# Patient Record
Sex: Female | Born: 1960 | Race: White | Hispanic: No | Marital: Married
Health system: Midwestern US, Academic
[De-identification: ages and names within clinical notes are randomized; demographics above are authoritative.]

---

## 2021-12-19 IMAGING — MR MR lumbar spine wo con
5 of 6 series · 20 of 48 positions shown · non-contrast
Comparison: None available

STUDY TYPE: MRI lumbar spine
TECHNIQUE: Multiplanar multi echo MR imaging was acquired through the lumbar spine
HISTORY: Lower back pain and neck pain, feels a lump in area of lower L-spine, pain down both legs and when sleeping

[Series 101: survey · axial · 10.0mm · 1.56mm/px · z∈[-15,+199]mm · 3 of 9 slices shown]
[im 1/9]
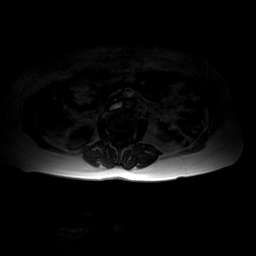
[im 5/9]
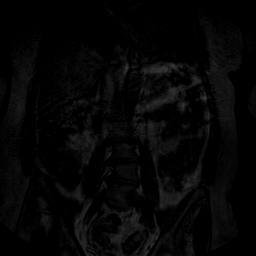
[im 9/9]
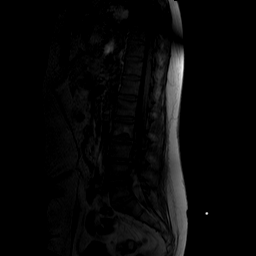

[Series 301: t2w_drive · sagittal · 4.0mm · 0.49mm/px · 5 of 16 slices shown]
[im 1/16]
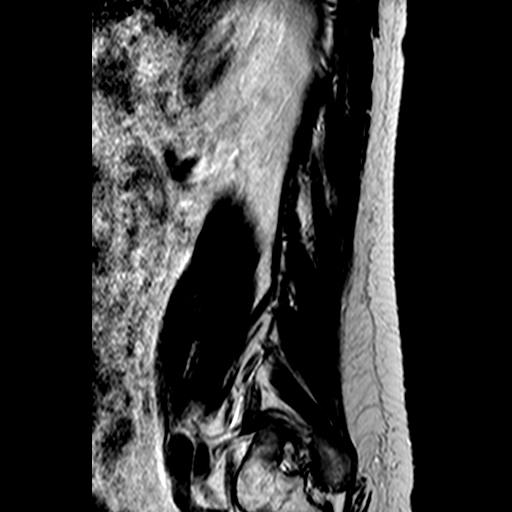
[im 4/16]
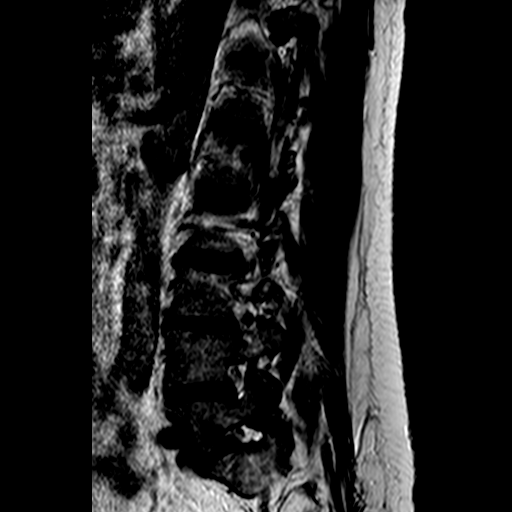
[im 8/16]
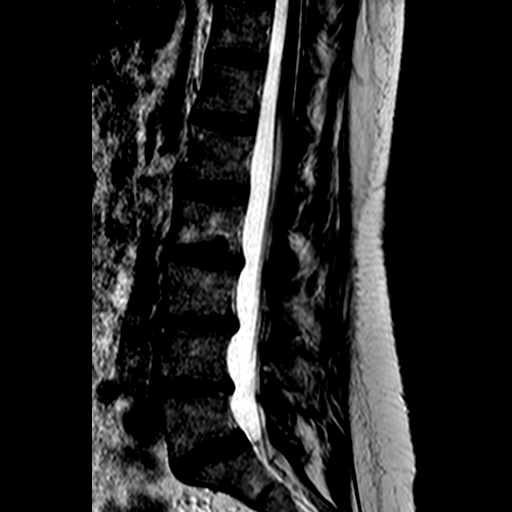
[im 12/16]
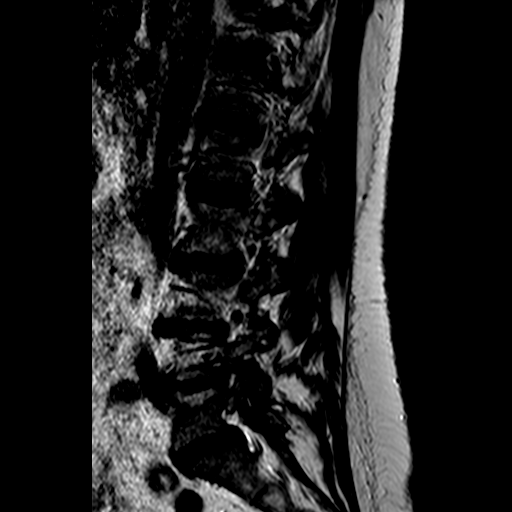
[im 16/16]
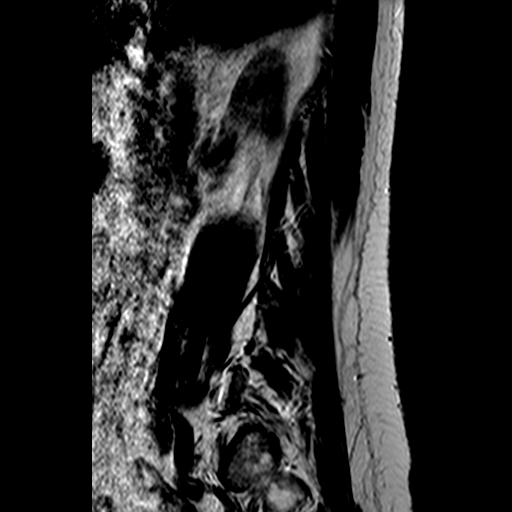

[Series 401: stir_opt. · sagittal · 4.0mm · 0.48mm/px · 5 of 16 slices shown]
[im 1/16]
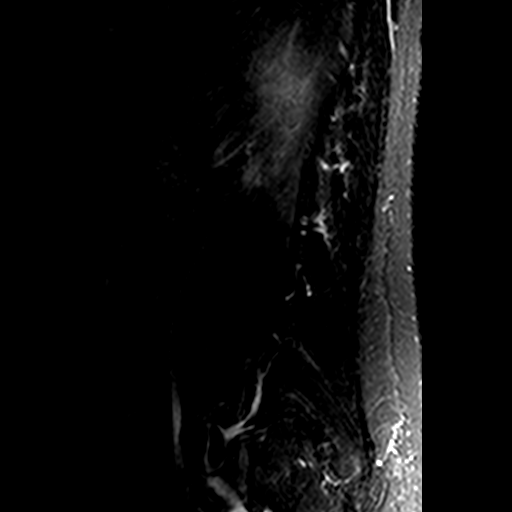
[im 4/16]
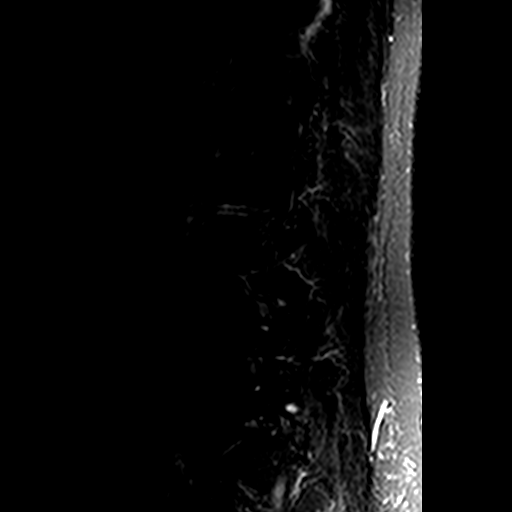
[im 8/16]
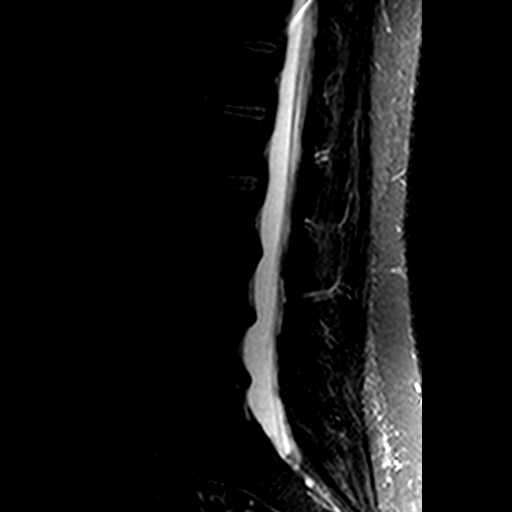
[im 12/16]
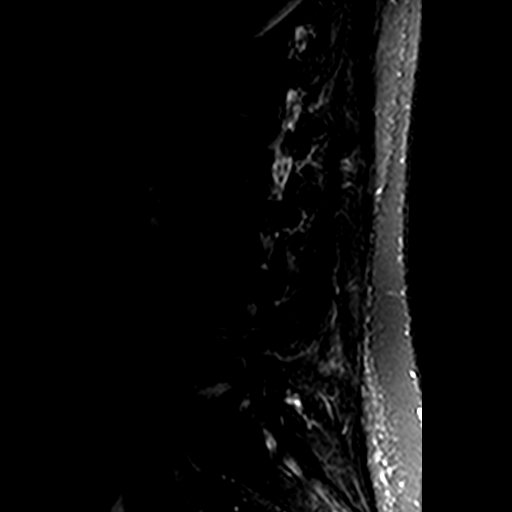
[im 16/16]
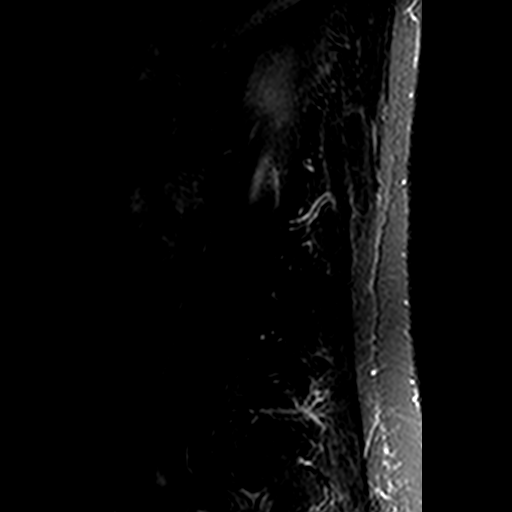

[Series 502: et1w_tse · sagittal · 4.0mm · 0.48mm/px · 5 of 16 slices shown]
[im 1/16]
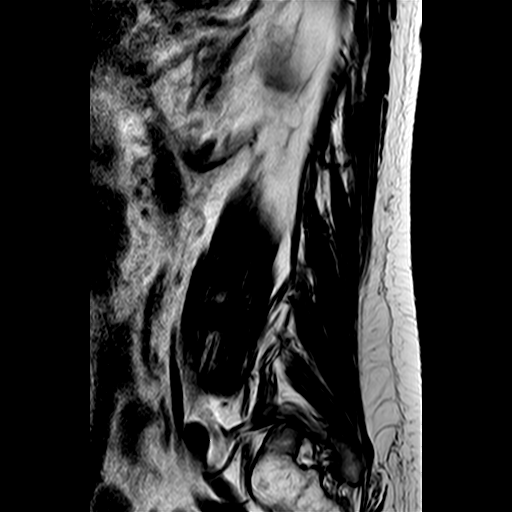
[im 4/16]
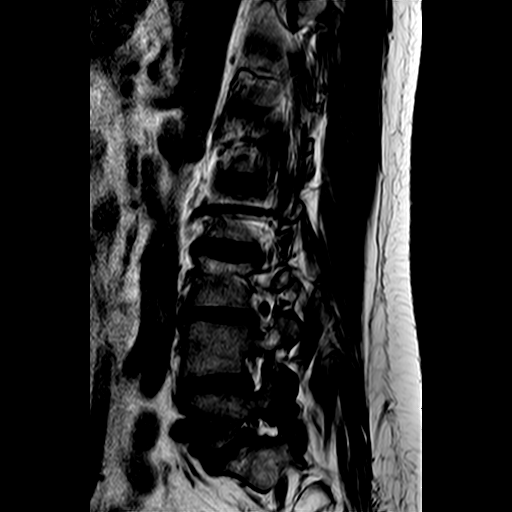
[im 8/16]
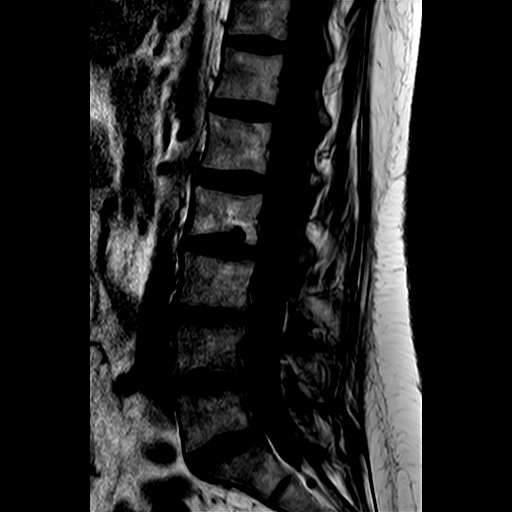
[im 12/16]
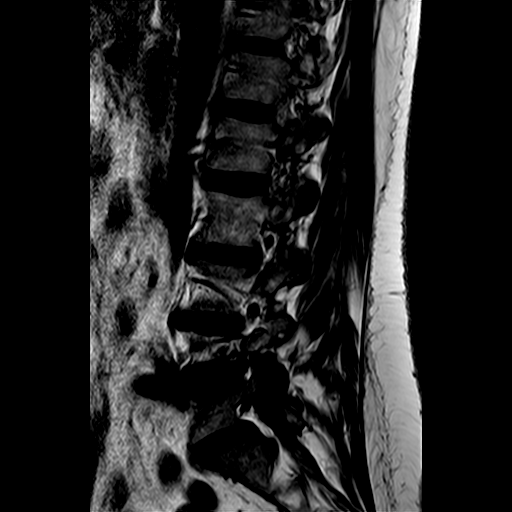
[im 16/16]
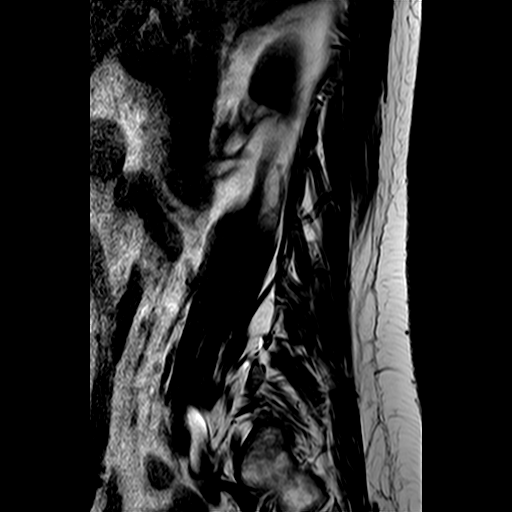

[Series 602: et1 ax stack_ · axial · 4.0mm · 0.41mm/px · z∈[-134,-107]mm · 2 of 45 slices shown]
[im 1/45]
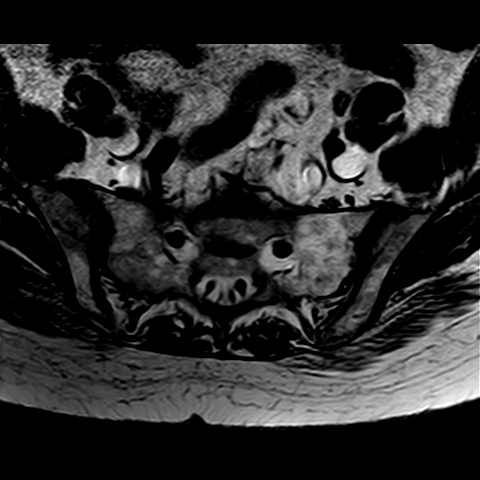
[im 7/45]
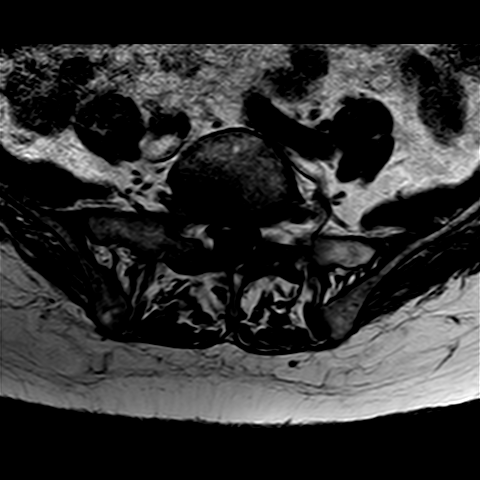

[20 of 48 positions shown; findings below may reference images not displayed]

FINDINGS: There is straightening of the normal lumbar lordosis. Multiple T1 and T2 hyperintense small lesions are seen within the lumbar vertebral bodies consistent with hemangiomata. There is a Schmorl's node along the inferior endplate of L2. There is disc height loss and desiccation with posterior disc bulging most notable at L3-L4 and L4-L5. There are endplate degenerative changes. Conus medullaris demonstrates normal signal intensity morphology and terminates at L1. No abnormal collection is seen within the thecal sac. Paraspinal muscles appear overall symmetric. There is mild fluid seen within the facets diffusely.
At L1-L2 there is no significant central or foraminal narrowing.
At L2-L3, there is mild diffuse posterior disc bulge and mild ligamentum flavum and facet arthropathy with fluid in the facets. There is mild effacement of the lateral recesses.
Al 3-L4, there is mild diffuse posterior disc bulge. This is predominantly diffuse and broad-based. There is mild ligamentum flavum and facet arthropathy with fluid seen within the facets. There is no significant central canal narrowing. There is effacement of the lateral recesses and mild to moderate right foraminal narrowing and mild left foraminal narrowing.
At L4-L5 there is mild diffuse posterior disc bulge and moderate ligamentum flavum facet arthropathy. There is no significant central canal narrowing. There is moderate to severe right and moderate left foraminal narrowing.
At L5-S1 there is minimal posterior disc bulge and mild to moderate ligamentum flavum and facet arthropathy with fluid seen within the facets. There is mild effacement of the lateral recesses and mild right and moderate left foraminal narrowing.
IMPRESSION: 1. Disc disease and facet arthropathy within the lower lumbar spine. There is no significant central canal narrowing and there is no acute compression deformity.
2. There is mild to moderate right foraminal narrowing at L3-L4. There is moderate to severe right and moderate left foraminal narrowing at L4-L5. There is mild right and moderate left foraminal narrowing at L5-S1.

## 2021-12-19 IMAGING — MR MR cervical spine wo con
4 of 7 series · 22 of 48 positions shown · non-contrast
Comparison: None available

STUDY TYPE: MRI cervical spine without contrast
TECHNIQUE: Multiplanar multi echo MR imaging was acquired through the cervical spine
HISTORY: Lower back pain and neck pain, lump in lower C-spine area
There is very minimal anterolisthesis of C2 over C3. There is no significant central foraminal narrowing.
At C3-C4 there is mild posterior disc osteophyte complex and mild uncovertebral arthropathy without significant central foraminal narrowing.
At C4-C5 there is moderate posterior disc osteophyte complex and uncovertebral arthropathy with minimal central and foraminal narrowing bilaterally.
At C5-C6 there is moderate posterior disc osteophyte complex and a copy. There is mild narrowing and mild foraminal narrowing.
At C6-C7 there is minimal posterior disc osteophyte complex without significant uncovertebral arthropathy. There is no significant central canal narrowing.
At C7-T1 there is minimal left foraminal narrowing.

[Series 101: survey · axial · 10.0mm · 1.17mm/px · z∈[-15,+149]mm · 3 of 9 slices shown]
[im 1/9]
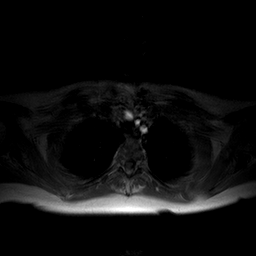
[im 5/9]
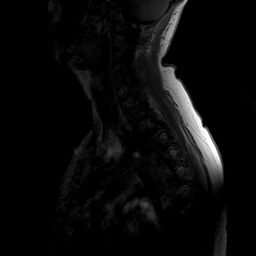
[im 9/9]
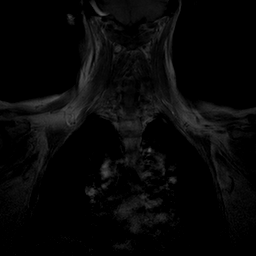

[Series 301: T2 · sagittal · 3.0mm · 0.39mm/px · 5 of 17 slices shown (1 of 2)]
[im 1/17]
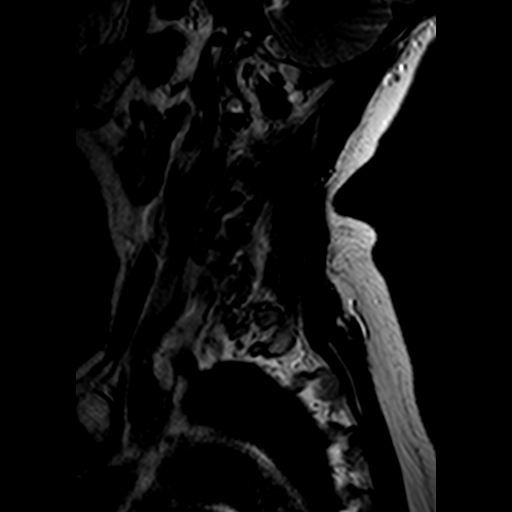
[im 5/17]
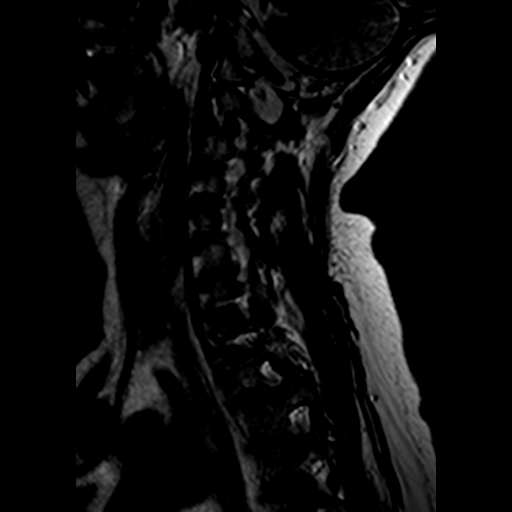
[im 9/17]
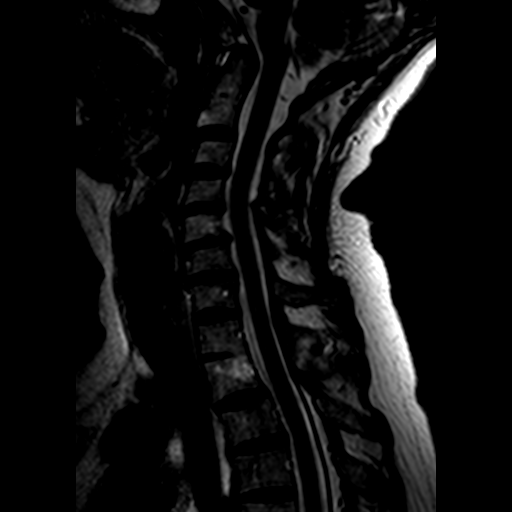
[im 13/17]
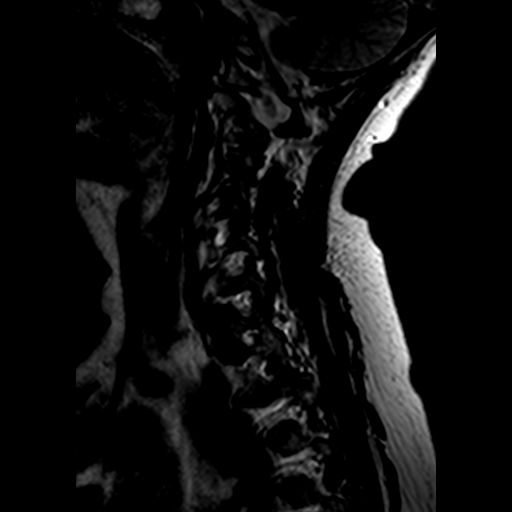
[im 17/17]
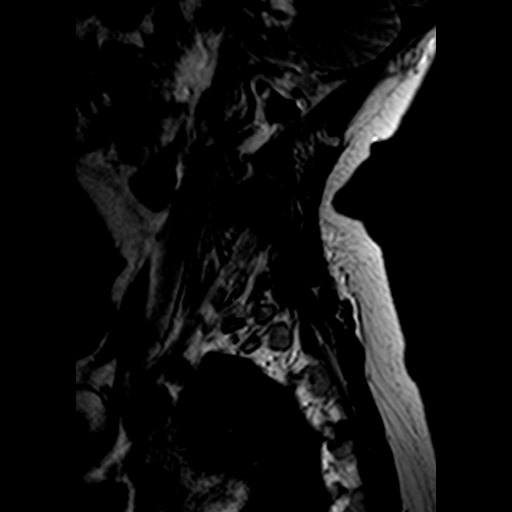

[Series 501: STIR · sagittal · 3.0mm · 0.50mm/px · 3 of 17 slices shown]
[im 1/17]
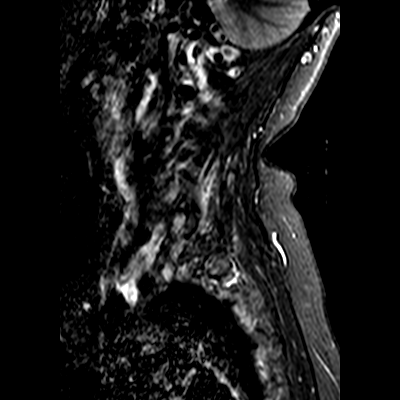
[im 11/17]
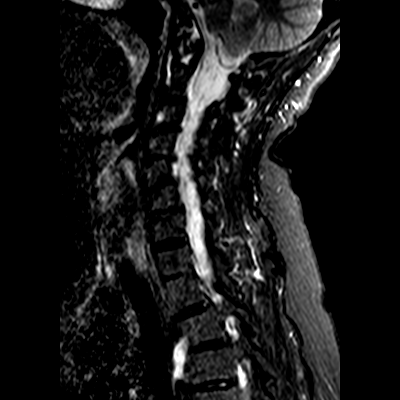
[im 17/17]
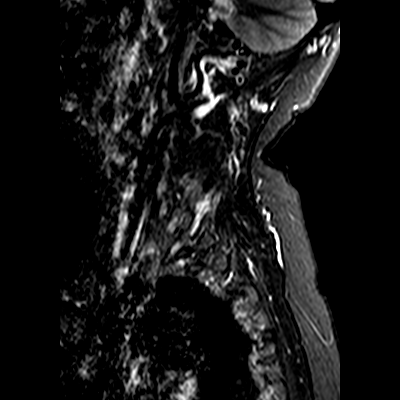

[Series 701: T2 · axial · 3.0mm · 0.39mm/px · z∈[-40,+83]mm · 11 of 43 slices shown (2 of 2)]
[im 1/43]
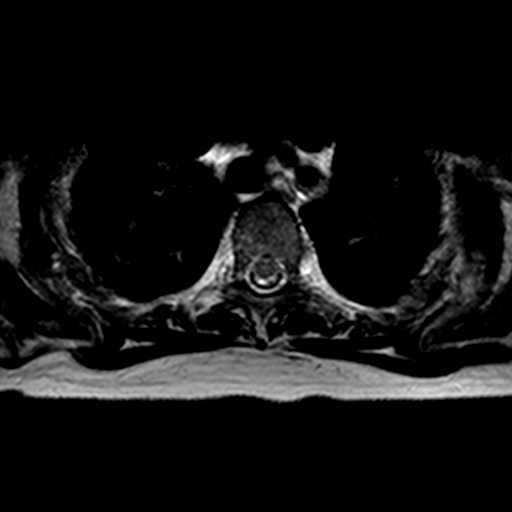
[im 5/43]
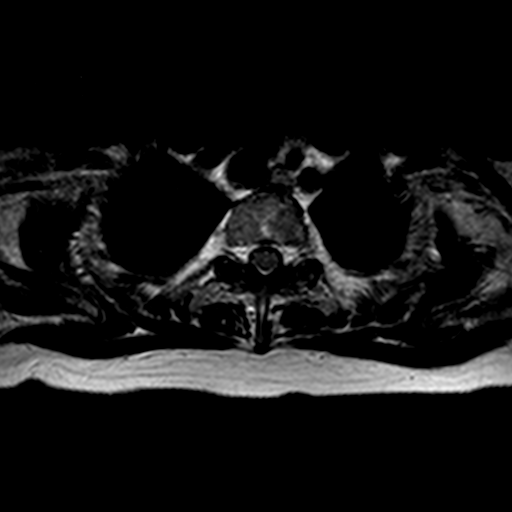
[im 9/43]
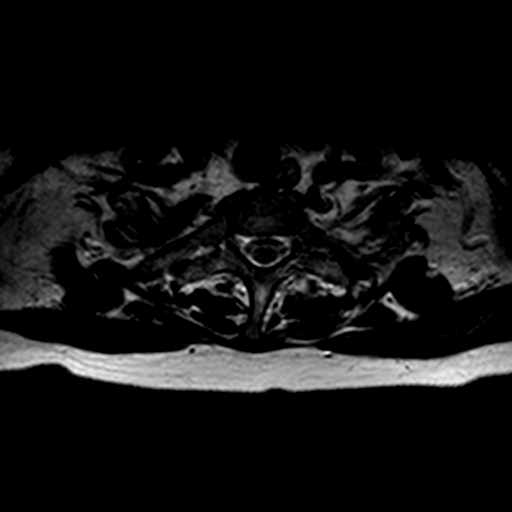
[im 13/43]
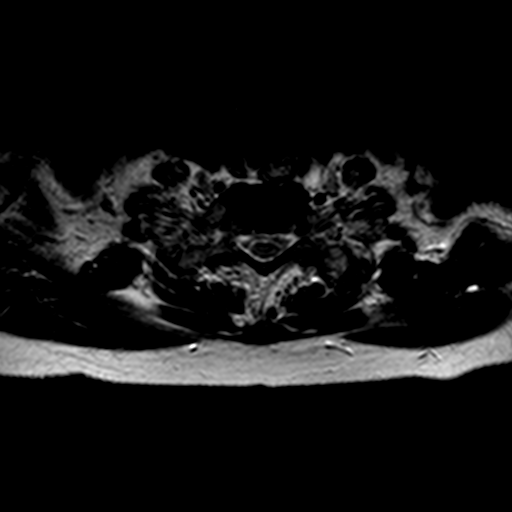
[im 17/43]
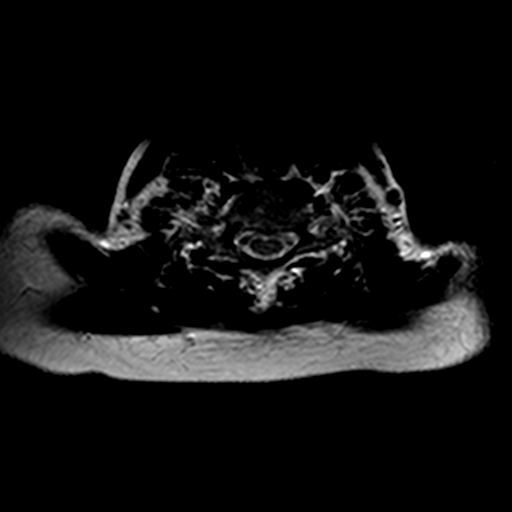
[im 22/43]
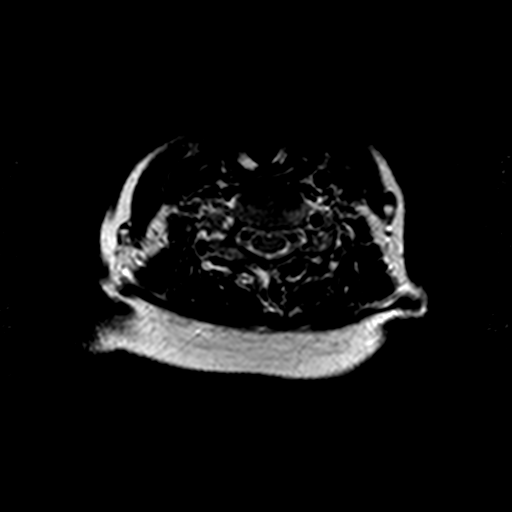
[im 26/43]
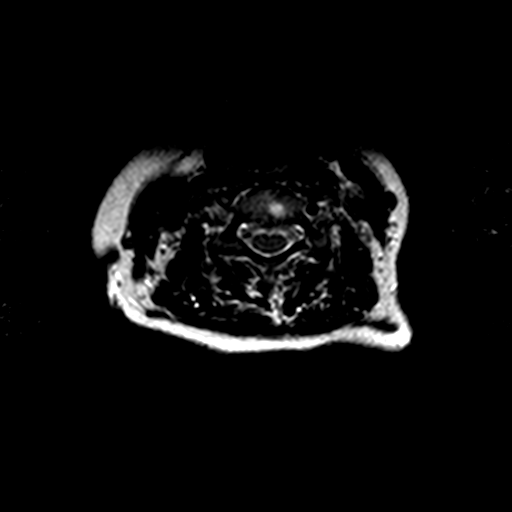
[im 30/43]
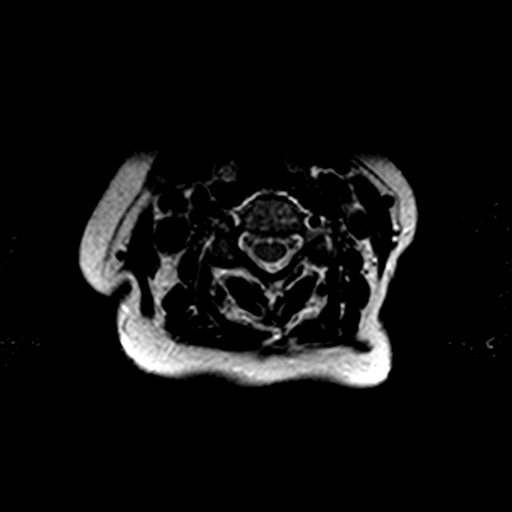
[im 34/43]
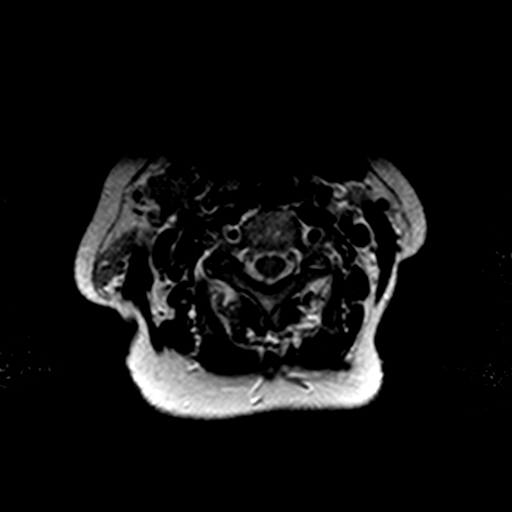
[im 38/43]
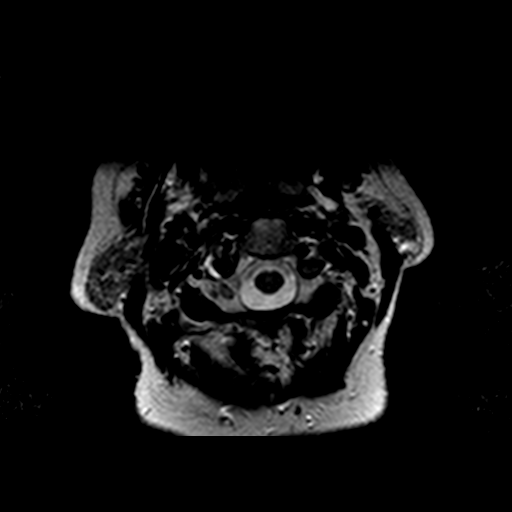
[im 43/43]
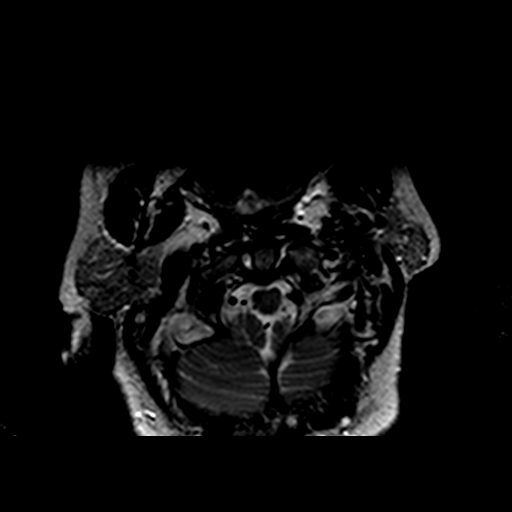

[22 of 48 positions shown; findings below may reference images not displayed]

IMPRESSION: 1. No obvious soft tissue mass is seen within limits of technique and motion artifact within the paraspinal region.
2. There is no acute compression deformity within the cervical spine.
3. There are round T2 hyperintense lesions seen at C5 and T2 respectively which could relate to atypical hemangiomata. Correlate with clinical circumstance and history of malignancy.
4. No acute compression deformity.
5. Mid and lower cervical degenerative change with posterior disc osteophyte complexes and uncovertebral arthropathy. There is mild central canal narrowing at C5-C6 and to a lesser degree at C4-C5 with mild bilateral foraminal narrowing. No severe level of central or foraminal narrowing is seen.

## 2022-02-22 ENCOUNTER — Encounter: Admit: 2022-02-22 | Discharge: 2022-02-22 | Payer: BC Managed Care – PPO

## 2022-04-12 ENCOUNTER — Encounter: Admit: 2022-04-12 | Discharge: 2022-04-12 | Payer: BC Managed Care – PPO

## 2022-04-13 ENCOUNTER — Encounter: Admit: 2022-04-13 | Discharge: 2022-04-13 | Payer: BC Managed Care – PPO

## 2022-06-11 ENCOUNTER — Encounter: Admit: 2022-06-11 | Discharge: 2022-06-11 | Payer: BC Managed Care – PPO

## 2022-06-11 DIAGNOSIS — G959 Disease of spinal cord, unspecified: Secondary | ICD-10-CM

## 2022-06-11 NOTE — Patient Instructions
It was nice to see you today.  Thank you for choosing to visit our clinic.  Your time is important, and if you had to wait today, we do apologize.  Our goal is to run exactly on time, however, on occasion, we get behind in clinic due to unexpected patient issues.  We appreciate your patience.    General Instructions:  Scheduling:  Our scheduling phone number is [phone].  How to reach our office:  Please send a MyChart message to the [facility] or leave a voicemail for the nurse, Nyah Shepherd, RN, BSN at [phone]. Please note messages after 3 PM may not be answered until the next business day.  How to get a medication refill:  Please use the MyChart Refill request or contact your pharmacy directly to request medication refills.  Please allow 72 business hours for your request to be completed.    Support: for many chronic illnesses information is available through [facility] at [email or web address] or [phone].    For help with MyChart:  Please call [phone].    For questions on nights, weekends or holidays:  call the Operator at [phone], and ask for the doctor on call for Neurosurgery.    For more information on spinal conditions:  please visit [email or web address]   Our office fax number is: [phone]    Again, thank you for coming in today and allowing us to take part in your care.      Bejamin Hackbart, RN, BSN  Clinical Nurse Coordinator for Dr. Ifije Ohiorhenuan  Marc A. Asher, MD, [facility]  [facility]  Phone [phone]  Fax [phone]

## 2022-06-27 ENCOUNTER — Encounter: Admit: 2022-06-27 | Discharge: 2022-06-27 | Payer: 59

## 2022-06-28 ENCOUNTER — Encounter: Admit: 2022-06-28 | Discharge: 2022-06-28 | Payer: 59

## 2022-06-29 ENCOUNTER — Encounter: Admit: 2022-06-29 | Discharge: 2022-06-29 | Payer: 59

## 2022-06-29 NOTE — Telephone Encounter
Pre Visit Planning- New Patient    Left voicemail message.    Patient active in MyChart. MyChart message sent with appointment reminders and instructions. .     Imaging records received: MRI Lumbar Spine received received    Outside records received: received and paperwork printed    Updated chart: Not assessed    New patient paperwork complete: No

## 2022-07-03 ENCOUNTER — Encounter: Admit: 2022-07-03 | Discharge: 2022-07-03 | Payer: 59

## 2022-07-03 ENCOUNTER — Ambulatory Visit: Admit: 2022-07-03 | Discharge: 2022-07-03 | Payer: 59

## 2022-07-03 DIAGNOSIS — M5441 Lumbago with sciatica, right side: Secondary | ICD-10-CM

## 2022-07-03 DIAGNOSIS — M542 Cervicalgia: Secondary | ICD-10-CM

## 2022-07-03 DIAGNOSIS — G959 Disease of spinal cord, unspecified: Secondary | ICD-10-CM

## 2022-07-03 DIAGNOSIS — M48061 Spinal stenosis, lumbar region without neurogenic claudication: Secondary | ICD-10-CM

## 2022-07-03 NOTE — Progress Notes
Date of Service: 07/03/2022        Chief complaint    Chief Complaint   Patient presents with   ? New Patient     Low back and neck pain some tingling down the right leg       HPI     Lynn Baxter is a 61 y.o. female   In today with multiple complaints.  She complains of chronic neck pain with periodic bilateral upper extremity symptoms that seem to come and go.  Symptoms seem to be worst with reaching type activities in addition to driving.  She describes numbness and tingling involving the hands/fingers when doing so.  This usually improves with time and certainly is not constant.  She denies any issues with fine motor movements such as grasping small.  She does feel she perhaps does not grip as strong as she used to.  She denies any aggressive gait/balance issues.  Her main complaint today is her back pain.  She feels this is worse in the morning when she first gets up or after she has been sitting for periods of time and gets up.  She gets occasional symptoms into the hip/buttock and occasionally down the right lower extremity-this is posterior and lateral in nature.  She did receive a recent injection and reports this has helped significantly thus far.  She is unable to tell me which level this was given but thinks it was possibly the L4-5 level.  She maintains bowel/bladder control.  She complains of subjective right lower extremity weakness.  She has a history of polymyalgia rheumatica.                  Oswestry: Oswestry Total Score:: 38       PMH    No past medical history on file.        ROS  Review of Systems   Musculoskeletal: Positive for back pain, neck pain and neck stiffness.        Bilateral hip/buttock pain and symptoms occasionally into the bilateral lower extremities (right greater than left).  Chronic neck pain with bilateral upper extremity symptoms periodically-periodic numbness and tingling of the hands and fingers primarily with driving/reaching   All other systems reviewed and are negative.         FH    No family history on file.  Social History     Socioeconomic History   ? Marital status: Married   Tobacco Use   ? Smoking status: Never     Passive exposure: Never   ? Smokeless tobacco: Never           SH    No past surgical history on file.    Meds    ? acetaminophen (TYLENOL EXTRA STRENGTH) 500 mg tablet Take two tablets by mouth every 6 hours as needed.   ? alendronate (FOSAMAX) 70 mg tablet Take one tablet by mouth every 7 days.   ? CHOLEcalciferoL (vitamin D3) 1,000 units tablet Take one tablet by mouth daily.   ? HYDROcodone/acetaminophen (NORCO) 7.5/325 mg tablet Take one tablet by mouth every 6 hours as needed.   ? levothyroxine (SYNTHROID) 75 mcg tablet Take one tablet by mouth daily.   ? lidocaine (LIDODERM) 5 % topical patch Apply one patch topically to affected area DAILY PRN.   ? pregabalin (LYRICA) 50 mg capsule Take one capsule by mouth twice daily.   ? sotaloL (BETAPACE AF) 80 mg tablet Take one tablet by mouth twice daily.   ?  zolpidem (AMBIEN) 10 mg tablet Take one tablet by mouth daily.       Allergies    Allergies   Allergen Reactions   ? Cephalexin NAUSEA ONLY and NAUSEA AND VOMITING     Severe vomiting     ? Erythromycin RASH   ? Codeine NAUSEA ONLY and NAUSEA AND VOMITING   ? Meloxicam NAUSEA ONLY   ? Morphine NAUSEA ONLY and NAUSEA AND VOMITING         Exam  Physical Exam   Constitutional: she is oriented to person, place, and time. she appears well-developed and well-nourished.    Head: Normocephalic and atraumatic.    Eyes: Conjunctivae and EOM are normal.    Pulmonary/Chest: Effort normal. No respiratory distress.   Neurological: she is alert and oriented to person, place, and time. No cranial nerve deficit or sensory deficit. she exhibits normal muscle tone. Coordination normal.   Skin: Skin is warm and dry.   Psychiatric: she has a normal mood and affect. her behavior is normal. Judgment and thought content normal.    Vitals reviewed.  The patient alert and oriented and in no acute distress.  Gait is fairly steady/well-balanced.    Cervical and lumbosacral region skin dry and intact with no palpable irregularities or muscle spasm.  Range of motion: Full range of motion of the cervical spine with flexion/extension in addition to rotational movements.  Hip rotation free and painless bilaterally.  Seated straight leg raise negative bilaterally at 90 degrees, no root tension signs.    No calf tenderness or clonus.  Bilateral Hoffmann's not present.  Motor strength:   Quadriceps-       Right   5/5,  Left  5/5.                             Anterior tibialis-  Right  5/5,  Left  5/5.                             EHL-                   Right  5/5,  Left  5/5  Deep tendon reflexes: Patellar reflexes 3 out of 4 bilaterally, ankle reflexes 2 out of 4 bilaterally.    Vitals:   Vitals:    07/03/22 1523   BP: 116/79   BP Source: Arm, Left Upper   Pulse: 64   SpO2: 98%   PainSc: Four   Weight: 79.4 kg (175 lb)   Height: 170.2 cm (5' 7)     Body mass index is 27.41 kg/m?.      Imaging:   I independently reviewed the patient's imaging findings:  X-rays show no acute findings.  Degenerative changes L3-S1 noted with most significant collapse on the right at L4-5.  Cervical MRI dated 12/19/2021 shows no surgically significant stenosis and/or root compression.  She otherwise has multilevel degenerative change.  Lumbar MRI dated 12/19/2021 shows no significant stenosis centrally.  She has what we will call moderate appearing foraminal stenosis on the right at L4-5 and perhaps mild on the left.  Centrally she has no surgically significant stenosis in the lumbar spine.     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 [place] today.  Reviewed films/pathology with the patient in detail.  At this point recommend continue conservative/nonoperative care from a cervical and lumbar spine standpoint.  Patient tells me today she is not really looking for any surgical interventions anytime soon and can live with the symptoms as they currently are.  She will plan on continued follow-up with pain management to consider her other options with them.  She did have a recent injection last month which did help her back and hip symptoms significantly.  I recommended considering bilateral upper extremity EMG studies to look for other areas of impingement syndrome-she may have a component of carpal tunnel syndrome.  She wanted to hold on this for now.  I recommended discussing this with her PCP if she decides to pursue this avenue.  Patient is neurologically intact and was instructed to contact us if this were to change.  The patient is agreeable to the above treatment plan.  Several questions were answered and they were encouraged to contact us if symptoms changed/worsen or if they have any further questions.  Follow up as needed                                   Encounter Medications   Medications   ? acetaminophen (TYLENOL EXTRA STRENGTH) 500 mg tablet     Sig: Take two tablets by mouth every 6 hours as needed.   ? alendronate (FOSAMAX) 70 mg tablet     Sig: Take one tablet by mouth every 7 days.   ? CHOLEcalciferoL (vitamin D3) 1,000 units tablet     Sig: Take one tablet by mouth daily.   ? HYDROcodone/acetaminophen (NORCO) 7.5/325 mg tablet     Sig: Take one tablet by mouth every 6 hours as needed.   ? levothyroxine (SYNTHROID) 75 mcg tablet     Sig: Take one tablet by mouth daily.   ? lidocaine (LIDODERM) 5 % topical patch     Sig: Apply one patch topically to affected area DAILY PRN.   ? pregabalin (LYRICA) 50 mg capsule     Sig: Take one capsule by mouth twice daily.   ? sotaloL (BETAPACE AF) 80 mg tablet     Sig: Take one tablet by mouth twice daily.   ? zolpidem (AMBIEN) 10 mg tablet     Sig: Take one tablet by mouth daily.                              Please note that documentation of records were done during a busy neurosurgical clinic. Attempts have been made to review the document for any errors. Please excuse for brevity and typographical errors.

## 2022-11-22 ENCOUNTER — Encounter: Admit: 2022-11-22 | Discharge: 2022-11-22 | Payer: 59

## 2023-11-08 ENCOUNTER — Encounter: Admit: 2023-11-08 | Discharge: 2023-11-08 | Payer: PRIVATE HEALTH INSURANCE
